# Patient Record
Sex: Female | Born: 1970
Health system: Southern US, Community
[De-identification: ages and names within clinical notes are randomized; demographics above are authoritative.]

## PROBLEM LIST (undated history)

## (undated) DIAGNOSIS — G43909 Migraine, unspecified, not intractable, without status migrainosus: Secondary | ICD-10-CM

## (undated) DIAGNOSIS — J40 Bronchitis, not specified as acute or chronic: Secondary | ICD-10-CM

## (undated) HISTORY — PX: CHOLECYSTECTOMY: SHX55

## (undated) HISTORY — DX: Migraine, unspecified, not intractable, without status migrainosus: G43.909

## (undated) HISTORY — DX: Bronchitis, not specified as acute or chronic: J40

---

## 1999-01-05 HISTORY — PX: ECTOPIC PREGNANCY SURGERY: SHX613

## 2001-03-08 ENCOUNTER — Other Ambulatory Visit: Admission: RE | Admit: 2001-03-08 | Discharge: 2001-03-08 | Payer: Self-pay | Admitting: Obstetrics and Gynecology

## 2001-04-04 ENCOUNTER — Ambulatory Visit (HOSPITAL_COMMUNITY): Admission: RE | Admit: 2001-04-04 | Discharge: 2001-04-04 | Payer: Self-pay | Admitting: Obstetrics & Gynecology

## 2001-04-04 ENCOUNTER — Encounter: Payer: Self-pay | Admitting: Obstetrics & Gynecology

## 2013-04-18 ENCOUNTER — Ambulatory Visit (INDEPENDENT_AMBULATORY_CARE_PROVIDER_SITE_OTHER): Payer: BC Managed Care – PPO | Admitting: Endocrinology

## 2013-04-18 ENCOUNTER — Encounter: Payer: Self-pay | Admitting: Endocrinology

## 2013-04-18 VITALS — BP 122/76 | HR 80 | Temp 97.9°F | Ht 64.5 in | Wt 247.0 lb

## 2013-04-18 DIAGNOSIS — E0590 Thyrotoxicosis, unspecified without thyrotoxic crisis or storm: Secondary | ICD-10-CM

## 2013-04-18 MED ORDER — PROPYLTHIOURACIL 50 MG PO TABS: ORAL_TABLET | ORAL | Status: DC

## 2013-04-18 NOTE — Progress Notes (Signed)
   Subjective:    Patient ID: Traci RoyalsLisa Hammond, female    DOB: 06/06/1970, 43 y.o.   MRN: [id number]  HPI In 2011, pt was rx'ed x a few months for hyperthyroidism, with a daily medication.  It was stopped, due to normalization of her TFT.  In late 2014, she developed slight tremor of the hands, and assoc weight loss.  She was rx'ed tapazole and inderal.  She stopped the tapazole, as she says this was exacerbating her headaches.  She says she would not be able to be isolated from her family for i-131 rx.  No past medical history on file.  No past surgical history on file.  History   Social History  . Marital Status: Single    Spouse Name: N/A    Number of Children: N/A  . Years of Education: N/A   Occupational History  . Not on file.   Social History Main Topics  . Smoking status: Never Smoker   . Smokeless tobacco: Not on file  . Alcohol Use: Yes  . Drug Use: Not on file  . Sexual Activity: Not on file   Other Topics Concern  . Not on file   Social History Narrative  . No narrative on file    No current outpatient prescriptions on file prior to visit.   No current facility-administered medications on file prior to visit.    Not on File  No family history on file. adopted BP 122/76  Pulse 80  Temp(Src) 97.9 F (36.6 C) (Oral)  Ht 5' 4.5" (1.638 m)  Wt 247 lb (112.038 kg)  BMI 41.76 kg/m2  SpO2 97%   Review of Systems She has muscle weakness, fatigue, headache, anxiety, easy bruising, rhinorrhea, and palpitations.  She denies hoarseness, double vision, sob, diarrhea, polyuria, excessive diaphoresis, and numbness.  She has regular menses.      Objective:   Physical Exam VS: see vs page GEN: no distress HEAD: head: no deformity eyes: slight bilateral proptosis external nose and ears are normal mouth: no lesion seen NECK: thyroid is 3 times normal size.  No nodule CHEST WALL: no deformity LUNGS:  Clear to auscultation CV: reg rate and rhythm, no murmur ABD:  abdomen is soft, nontender.  no hepatosplenomegaly.  not distended.  no hernia.   MUSCULOSKELETAL: muscle bulk and strength are grossly normal.  no obvious joint swelling.  gait is normal and steady EXTEMITIES: no deformity.  no ulcer on the feet.  feet are of normal color and temp.  no edema PULSES: dorsalis pedis intact bilat.  no carotid bruit NEURO:  cn 2-12 grossly intact.   readily moves all 4's.  sensation is intact to touch on the feet SKIN:  Normal texture and temperature.  No rash or suspicious lesion is visible.   NODES:  None palpable at the neck PSYCH: alert, well-oriented.  Does not appear anxious nor depressed.   outside test results are reviewed: Free T4=4.41 Thyroid US: mildly heterogeneous    Assessment & Plan:  Grave's dz: persistent. Hyperthyroidism: recurrent off thionamide rx.  we discussed the causes, risks, and treatment options of hyperthyroidism.  She chooses to take PTU, rather than I-131 rx. Tachycardia: well-controlled on inderal.

## 2013-04-18 NOTE — Patient Instructions (Addendum)
i have sent a prescription to your pharmacy, to slow down the thyroid.  if ever you have fever while taking propylthiouracil, stop it and call us, because of the risk of a rare side-effect.  Also, please continue the same propranolol.  Please come back for a follow-up appointment in 2-4 weeks

## 2013-04-19 DIAGNOSIS — E0590 Thyrotoxicosis, unspecified without thyrotoxic crisis or storm: Secondary | ICD-10-CM | POA: Insufficient documentation

## 2013-05-16 ENCOUNTER — Ambulatory Visit: Payer: BC Managed Care – PPO | Admitting: Endocrinology

## 2013-05-30 ENCOUNTER — Ambulatory Visit (INDEPENDENT_AMBULATORY_CARE_PROVIDER_SITE_OTHER): Payer: BC Managed Care – PPO | Admitting: Endocrinology

## 2013-05-30 ENCOUNTER — Encounter: Payer: Self-pay | Admitting: Endocrinology

## 2013-05-30 VITALS — BP 124/84 | HR 95 | Temp 98.3°F | Ht 64.5 in | Wt 250.0 lb

## 2013-05-30 DIAGNOSIS — E0590 Thyrotoxicosis, unspecified without thyrotoxic crisis or storm: Secondary | ICD-10-CM

## 2013-05-30 LAB — T4, FREE: Free T4: 3.33 ng/dL — ABNORMAL HIGH (ref 0.60–1.60)

## 2013-05-30 LAB — TSH: TSH: 0.2 u[IU]/mL — ABNORMAL LOW (ref 0.35–4.50)

## 2013-05-30 MED ORDER — PROPRANOLOL HCL ER 60 MG PO CP24: 60.0000 mg | ORAL_CAPSULE | Freq: Every day | ORAL | Status: DC

## 2013-05-30 NOTE — Progress Notes (Signed)
   Subjective:    Patient ID: Traci Navarro, female    DOB: 1970-02-12, 43 y.o.   MRN: [id number]  HPI Pt returns for f/u of hyperthyroidism: dx'ed 2011; pt was rx'ed x a few months for hyperthyroidism, with a daily medication.  It was stopped, due to normalization of her TFT.  It recurred in late 2014, so she was rx'ed tapazole and inderal.  She did not tolerate tapazole (headache), so she was changed to PTU.  She says she would not be able to be isolated from her family for i-131 rx.  she feels much better in general recently.   No past medical history on file.  No past surgical history on file.  History   Social History  . Marital Status: Single    Spouse Name: N/A    Number of Children: N/A  . Years of Education: N/A   Occupational History  . Not on file.   Social History Main Topics  . Smoking status: Never Smoker   . Smokeless tobacco: Not on file  . Alcohol Use: Yes  . Drug Use: Not on file  . Sexual Activity: Not on file   Other Topics Concern  . Not on file   Social History Narrative  . No narrative on file    Current Outpatient Prescriptions on File Prior to Visit  Medication Sig Dispense Refill  . propylthiouracil (PTU) 50 MG tablet 3 pills, 3 times a day  270 tablet  2   No current facility-administered medications on file prior to visit.    Not on File  No family history on file.  BP 124/84  Pulse 95  Temp(Src) 98.3 F (36.8 C) (Oral)  Ht 5' 4.5" (1.638 m)  Wt 250 lb (113.399 kg)  BMI 42.27 kg/m2  SpO2 97%  Review of Systems Denies fever    Objective:   Physical Exam VITAL SIGNS:  See vs page GENERAL: no distress Skin: not diaphoretic Neuro: no tremor   Lab Results  Component Value Date   TSH 0.20* 05/30/2013      Assessment & Plan:  Hyperthyroidism, not better yet.

## 2013-05-30 NOTE — Patient Instructions (Addendum)
blood tests are being requested for you today.  We'll contact you with results. if ever you have fever while taking propylthiouracil, stop it and call us, because of the risk of a rare side-effect.  Also, please reduce the propranolol to 60 mg daily.  i have sent a prescription to your pharmacy Please come back for a follow-up appointment in 6 weeks.

## 2013-07-11 ENCOUNTER — Encounter: Payer: Self-pay | Admitting: Endocrinology

## 2013-07-11 ENCOUNTER — Ambulatory Visit (INDEPENDENT_AMBULATORY_CARE_PROVIDER_SITE_OTHER): Payer: BC Managed Care – PPO | Admitting: Endocrinology

## 2013-07-11 VITALS — BP 118/76 | HR 71 | Temp 98.4°F | Ht 64.5 in | Wt 256.0 lb

## 2013-07-11 DIAGNOSIS — E0590 Thyrotoxicosis, unspecified without thyrotoxic crisis or storm: Secondary | ICD-10-CM

## 2013-07-11 LAB — T4, FREE: FREE T4: 2 ng/dL — AB (ref 0.60–1.60)

## 2013-07-11 LAB — TSH: TSH: 0.01 u[IU]/mL — ABNORMAL LOW (ref 0.35–4.50)

## 2013-07-11 MED ORDER — METOPROLOL SUCCINATE ER 25 MG PO TB24: 25.0000 mg | ORAL_TABLET | Freq: Every day | ORAL | Status: DC

## 2013-07-11 NOTE — Progress Notes (Signed)
   Subjective:    Patient ID: Traci Navarro, female    DOB: 03/05/1970, 43 y.o.   MRN: [id number]  HPI Pt returns for f/u of hyperthyroidism: dx'ed 2011; pt was rx'ed x a few months for hyperthyroidism, with a daily medication.  It was stopped, due to normalization of her TFT.  It recurred in late 2014, so she was rx'ed tapazole and inderal.  She did not tolerate tapazole (headache), so she was changed to PTU.  She says she would not be able to be isolated from her family for i-131 rx.  she feels much better in general recently.   No past medical history on file.  No past surgical history on file.  History   Social History  . Marital Status: Single    Spouse Name: N/A    Number of Children: N/A  . Years of Education: N/A   Occupational History  . Not on file.   Social History Main Topics  . Smoking status: Never Smoker   . Smokeless tobacco: Not on file  . Alcohol Use: Yes  . Drug Use: Not on file  . Sexual Activity: Not on file   Other Topics Concern  . Not on file   Social History Narrative  . No narrative on file    No current outpatient prescriptions on file prior to visit.   No current facility-administered medications on file prior to visit.    Not on File  No family history on file.  BP 118/76  Pulse 71  Temp(Src) 98.4 F (36.9 C) (Oral)  Ht 5' 4.5" (1.638 m)  Wt 256 lb (116.121 kg)  BMI 43.28 kg/m2  SpO2 99%  Review of Systems Denies fever    Objective:   Physical Exam VITAL SIGNS:  See vs page GENERAL: no distress. NECK: thyroid is twice normal size, diffuse.  No nodule   Lab Results  Component Value Date   TSH 0.01* 07/11/2013      Assessment & Plan:  Hyperthyroidism: only slightly improved.   Patient is advised the following: Patient Instructions  i have sent a prescription to your pharmacy, to change the propranolol to a pill that we can use at a lower amount.   blood tests are being requested for you today.  We'll contact you with  results. Please come back for a follow-up appointment in 2 months.    improved. Please increase medication to 4 pills, 3 times a day

## 2013-07-11 NOTE — Patient Instructions (Signed)
i have sent a prescription to your pharmacy, to change the propranolol to a pill that we can use at a lower amount.   blood tests are being requested for you today.  We'll contact you with results. Please come back for a follow-up appointment in 2 months.

## 2013-07-12 ENCOUNTER — Other Ambulatory Visit: Payer: Self-pay

## 2013-07-12 MED ORDER — PROPYLTHIOURACIL 50 MG PO TABS: ORAL_TABLET | ORAL | Status: DC

## 2013-09-11 ENCOUNTER — Ambulatory Visit: Payer: BC Managed Care – PPO | Admitting: Endocrinology

## 2014-08-05 ENCOUNTER — Other Ambulatory Visit: Payer: Self-pay | Admitting: Endocrinology

## 2014-08-05 NOTE — Telephone Encounter (Signed)
Please refill x 1 Ov is due  

## 2016-04-29 ENCOUNTER — Emergency Department (HOSPITAL_COMMUNITY)
Admission: EM | Admit: 2016-04-29 | Discharge: 2016-04-29 | Disposition: A | Discharge status: Home / Self Care | Payer: No Typology Code available for payment source | Attending: Emergency Medicine | Admitting: Emergency Medicine

## 2016-04-29 ENCOUNTER — Encounter (HOSPITAL_COMMUNITY): Payer: Self-pay

## 2016-04-29 ENCOUNTER — Emergency Department (HOSPITAL_COMMUNITY): Payer: No Typology Code available for payment source

## 2016-04-29 DIAGNOSIS — Y92410 Unspecified street and highway as the place of occurrence of the external cause: Secondary | ICD-10-CM | POA: Insufficient documentation

## 2016-04-29 DIAGNOSIS — S199XXA Unspecified injury of neck, initial encounter: Secondary | ICD-10-CM | POA: Diagnosis present

## 2016-04-29 DIAGNOSIS — M545 Low back pain: Secondary | ICD-10-CM | POA: Insufficient documentation

## 2016-04-29 DIAGNOSIS — Y999 Unspecified external cause status: Secondary | ICD-10-CM | POA: Diagnosis not present

## 2016-04-29 DIAGNOSIS — Y9389 Activity, other specified: Secondary | ICD-10-CM | POA: Diagnosis not present

## 2016-04-29 DIAGNOSIS — Z79899 Other long term (current) drug therapy: Secondary | ICD-10-CM | POA: Diagnosis not present

## 2016-04-29 DIAGNOSIS — M542 Cervicalgia: Secondary | ICD-10-CM | POA: Insufficient documentation

## 2016-04-29 LAB — POC URINE PREG, ED: PREG TEST UR: NEGATIVE

## 2016-04-29 MED ORDER — CYCLOBENZAPRINE HCL 10 MG PO TABS: 10.0000 mg | ORAL_TABLET | Freq: Two times a day (BID) | ORAL | 0 refills | Status: DC | PRN

## 2016-04-29 MED ORDER — IBUPROFEN 800 MG PO TABS
800.0000 mg | ORAL_TABLET | Freq: Once | ORAL | Status: AC
  Administered 2016-04-29: 800 mg via ORAL
  Filled 2016-04-29: qty 1

## 2016-04-29 MED ORDER — IBUPROFEN 600 MG PO TABS: 600.0000 mg | ORAL_TABLET | Freq: Four times a day (QID) | ORAL | 0 refills | Status: AC | PRN

## 2016-04-29 NOTE — ED Notes (Signed)
c-collar applied at triage 
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
## 2018-01-09 DIAGNOSIS — M79601 Pain in right arm: Secondary | ICD-10-CM | POA: Diagnosis not present

## 2018-01-09 DIAGNOSIS — M542 Cervicalgia: Secondary | ICD-10-CM | POA: Diagnosis not present

## 2018-01-09 DIAGNOSIS — R200 Anesthesia of skin: Secondary | ICD-10-CM | POA: Diagnosis not present

## 2018-01-12 DIAGNOSIS — M4802 Spinal stenosis, cervical region: Secondary | ICD-10-CM | POA: Diagnosis not present

## 2018-01-12 DIAGNOSIS — M542 Cervicalgia: Secondary | ICD-10-CM | POA: Diagnosis not present

## 2018-01-12 DIAGNOSIS — M5030 Other cervical disc degeneration, unspecified cervical region: Secondary | ICD-10-CM | POA: Diagnosis not present

## 2018-02-01 DIAGNOSIS — M79601 Pain in right arm: Secondary | ICD-10-CM | POA: Diagnosis not present

## 2018-02-01 DIAGNOSIS — M4802 Spinal stenosis, cervical region: Secondary | ICD-10-CM | POA: Diagnosis not present

## 2018-02-01 DIAGNOSIS — M542 Cervicalgia: Secondary | ICD-10-CM | POA: Diagnosis not present

## 2018-02-09 DIAGNOSIS — M542 Cervicalgia: Secondary | ICD-10-CM | POA: Diagnosis not present

## 2018-02-09 DIAGNOSIS — M25511 Pain in right shoulder: Secondary | ICD-10-CM | POA: Diagnosis not present

## 2018-02-09 DIAGNOSIS — M4802 Spinal stenosis, cervical region: Secondary | ICD-10-CM | POA: Diagnosis not present

## 2018-03-06 DIAGNOSIS — M542 Cervicalgia: Secondary | ICD-10-CM | POA: Diagnosis not present

## 2018-03-06 DIAGNOSIS — M4802 Spinal stenosis, cervical region: Secondary | ICD-10-CM | POA: Diagnosis not present

## 2018-03-14 DIAGNOSIS — M4802 Spinal stenosis, cervical region: Secondary | ICD-10-CM | POA: Diagnosis not present

## 2018-03-14 DIAGNOSIS — M542 Cervicalgia: Secondary | ICD-10-CM | POA: Diagnosis not present

## 2018-03-14 DIAGNOSIS — M79601 Pain in right arm: Secondary | ICD-10-CM | POA: Diagnosis not present

## 2018-04-05 ENCOUNTER — Ambulatory Visit (INDEPENDENT_AMBULATORY_CARE_PROVIDER_SITE_OTHER): Payer: Self-pay

## 2018-04-05 ENCOUNTER — Other Ambulatory Visit: Payer: Self-pay

## 2018-04-05 ENCOUNTER — Ambulatory Visit (INDEPENDENT_AMBULATORY_CARE_PROVIDER_SITE_OTHER): Payer: Self-pay | Admitting: Orthopaedic Surgery

## 2018-04-05 ENCOUNTER — Encounter (INDEPENDENT_AMBULATORY_CARE_PROVIDER_SITE_OTHER): Payer: Self-pay | Admitting: Orthopaedic Surgery

## 2018-04-05 VITALS — Ht 64.0 in | Wt 286.0 lb

## 2018-04-05 DIAGNOSIS — Z6841 Body Mass Index (BMI) 40.0 and over, adult: Secondary | ICD-10-CM | POA: Insufficient documentation

## 2018-04-05 DIAGNOSIS — M25521 Pain in right elbow: Secondary | ICD-10-CM

## 2018-04-05 MED ORDER — METHYLPREDNISOLONE ACETATE 40 MG/ML IJ SUSP
40.0000 mg | INTRAMUSCULAR | Status: AC | PRN
  Administered 2018-04-05: 40 mg

## 2018-04-05 MED ORDER — LIDOCAINE HCL 1 % IJ SOLN
1.0000 mL | INTRAMUSCULAR | Status: AC | PRN
  Administered 2018-04-05: 10:00:00 1 mL

## 2018-04-05 MED ORDER — BUPIVACAINE HCL 0.5 % IJ SOLN
1.0000 mL | INTRAMUSCULAR | Status: AC | PRN
  Administered 2018-04-05: 10:00:00 1 mL
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
## 2018-04-26 ENCOUNTER — Encounter (INDEPENDENT_AMBULATORY_CARE_PROVIDER_SITE_OTHER): Payer: Self-pay | Admitting: Orthopaedic Surgery

## 2018-04-26 ENCOUNTER — Other Ambulatory Visit: Payer: Self-pay

## 2018-04-26 ENCOUNTER — Ambulatory Visit (INDEPENDENT_AMBULATORY_CARE_PROVIDER_SITE_OTHER): Payer: 59 | Admitting: Orthopaedic Surgery

## 2018-04-26 VITALS — Ht 64.0 in | Wt 286.0 lb

## 2018-04-26 DIAGNOSIS — M25521 Pain in right elbow: Secondary | ICD-10-CM | POA: Diagnosis not present

## 2018-04-26 NOTE — Progress Notes (Signed)
   Office Visit Note   Patient: Traci Navarro           Date of Birth: 1970-12-19           MRN: [id number] Visit Date: 04/26/2018              Requested by: Sasser, Clarene Critchley, MD [address] [place], [place] [address] PCP: Estanislado Pandy, MD   Assessment & Plan: Visit Diagnoses:  1. Pain in right elbow     Plan: Minimal relief with cortisone injection over the lateral epicondyles right elbow.  Plan  MRI scan right elbow.  Might also have compression of either radial or median nerve based on some of her symptoms.  Large arms and forearms which make it somewhat difficult to evaluate  Follow-Up Instructions: No follow-ups on file.   Orders:  No orders of the defined types were placed in this encounter.  No orders of the defined types were placed in this encounter.     Procedures: No procedures performed   Clinical Data: No additional findings.   Subjective: Chief Complaint  Patient presents with  . Right Elbow - Follow-up  Patient presents today for follow up on her right elbow. She received a cortisone injection on 04/05/18. She has been doing PT and home exercises. She is having forearm and wrist pain with exercises.  Also has been using Voltaren gel which gives her some relief.  Pain is still localized along the lateral aspect of her right elbow associated with forearm pain and some discomfort referred to the dorsum of her right hand. HPI  Review of Systems   Objective: Vital Signs: Ht 5\' 4"  (1.626 m)   Wt 286 lb (129.7 kg)   BMI 49.09 kg/m   Physical Exam  Ortho Exam awake alert and oriented x3.  Comfortable sitting.  No acute distress.  Full range of motion of right elbow in pronation supination flexion and extension.  Still has some discomfort over the lateral epicondyle.  No skin changes.  Large arms and forearms.  No obvious masses.  Difficult to determine if there is any compression of radial nerve as she does have some discomfort of the dorsum of her hand.  No obvious  carpal tunnel.  No evidence of de Quervain's  Specialty Comments:  No specialty comments available.  Imaging: No results found.   PMFS History: Patient Active Problem List   Diagnosis Date Noted  . Pain in right elbow 04/05/2018  . BMI 45.0-49.9, adult (HCC) 04/05/2018  . Hyperthyroidism 04/19/2013   Past Medical History:  Diagnosis Date  . Bronchitis   . Migraines     History reviewed. No pertinent family history.  Past Surgical History:  Procedure Laterality Date  . CHOLECYSTECTOMY     Social History   Occupational History  . Not on file  Tobacco Use  . Smoking status: Never Smoker  . Smokeless tobacco: Never Used  Substance and Sexual Activity  . Alcohol use: Not Currently  . Drug use: Never  . Sexual activity: Not on file

## 2018-04-26 NOTE — Addendum Note (Signed)
Addended by: Wendi Maya on: 04/26/2018 02:43 PM   Modules accepted: Orders

## 2018-04-28 DIAGNOSIS — M25521 Pain in right elbow: Secondary | ICD-10-CM | POA: Diagnosis not present

## 2018-04-28 DIAGNOSIS — S46211A Strain of muscle, fascia and tendon of other parts of biceps, right arm, initial encounter: Secondary | ICD-10-CM | POA: Diagnosis not present

## 2018-04-28 DIAGNOSIS — M79631 Pain in right forearm: Secondary | ICD-10-CM | POA: Diagnosis not present

## 2018-05-03 ENCOUNTER — Ambulatory Visit (INDEPENDENT_AMBULATORY_CARE_PROVIDER_SITE_OTHER): Payer: 59 | Admitting: Orthopaedic Surgery

## 2018-05-03 ENCOUNTER — Other Ambulatory Visit: Payer: Self-pay

## 2018-05-03 ENCOUNTER — Encounter (INDEPENDENT_AMBULATORY_CARE_PROVIDER_SITE_OTHER): Payer: Self-pay | Admitting: Orthopaedic Surgery

## 2018-05-03 VITALS — Ht 64.0 in | Wt 286.0 lb

## 2018-05-03 DIAGNOSIS — M25521 Pain in right elbow: Secondary | ICD-10-CM

## 2018-05-03 NOTE — Progress Notes (Signed)
   Office Visit Note   Patient: Traci Navarro           Date of Birth: [birth year]           MRN: [id number] Visit Date: 05/03/2018              Requested by: Sasser, Clarene Critchley, MD [address] [place], [place] [address] PCP: Estanislado Pandy, MD   Assessment & Plan: Visit Diagnoses:  1. Pain in right elbow     Plan: MRI scan of right elbow reveals a small partial tear of the insertion of the biceps tendon on the proximal radius.  This seems to fit the area of her pain.  She does not have evidence of tennis elbow by MRI scan and presently is not having any pain laterally.  There was no evidence of ulnar or radial nerve or median nerve entrapment.  Have suggested exercises, Voltaren gel and anti-inflammatory medicines as well as time.  Also discussed limitation of activities and lifting heavy weights.  Hopefully this will resolve without further intervention  Follow-Up Instructions: Return if symptoms worsen or fail to improve.   Orders:  No orders of the defined types were placed in this encounter.  No orders of the defined types were placed in this encounter.     Procedures: No procedures performed   Clinical Data: No additional findings.   Subjective: Chief Complaint  Patient presents with  . Right Elbow - Follow-up  Patient presents today for a one week follow up on her right elbow. She said that she had noticed no change in the last week. She had an MRI on 04/28/18 and is here today to discuss those results. She takes Tylenol as needed for pain.  HPI  Review of Systems   Objective: Vital Signs: Ht 5\' 4"  (1.626 m)   Wt 286 lb (129.7 kg)   BMI 49.09 kg/m   Physical Exam Constitutional:      Appearance: She is well-developed.  Eyes:     Pupils: Pupils are equal, round, and reactive to light.  Pulmonary:     Effort: Pulmonary effort is normal.  Skin:    General: Skin is warm and dry.  Neurological:     Mental Status: She is alert and oriented to person, place, and time.   Psychiatric:        Behavior: Behavior normal.     Ortho Exam large arms.  No localized tenderness at the lateral right elbow.  Full range of motion.  Some discomfort in the area of the biceps tendon distally.  Some pain with forced flexion.  Neurologically intact.  Skin intact.  No evidence of instability Specialty Comments:  No specialty comments available.  Imaging: No results found.   PMFS History: Patient Active Problem List   Diagnosis Date Noted  . Pain in right elbow 04/05/2018  . BMI 45.0-49.9, adult (HCC) 04/05/2018  . Hyperthyroidism 04/19/2013   Past Medical History:  Diagnosis Date  . Bronchitis   . Migraines     History reviewed. No pertinent family history.  Past Surgical History:  Procedure Laterality Date  . CHOLECYSTECTOMY     Social History   Occupational History  . Not on file  Tobacco Use  . Smoking status: Never Smoker  . Smokeless tobacco: Never Used  Substance and Sexual Activity  . Alcohol use: Not Currently  . Drug use: Never  . Sexual activity: Not on file

## 2021-06-21 ENCOUNTER — Encounter (HOSPITAL_COMMUNITY): Payer: Self-pay

## 2021-06-21 ENCOUNTER — Emergency Department (HOSPITAL_COMMUNITY)
Admission: EM | Admit: 2021-06-21 | Discharge: 2021-06-21 | Disposition: A | Discharge status: Home / Self Care | Payer: 59 | Attending: Emergency Medicine | Admitting: Emergency Medicine

## 2021-06-21 ENCOUNTER — Emergency Department (HOSPITAL_COMMUNITY): Payer: 59

## 2021-06-21 ENCOUNTER — Other Ambulatory Visit: Payer: Self-pay

## 2021-06-21 DIAGNOSIS — R42 Dizziness and giddiness: Secondary | ICD-10-CM | POA: Insufficient documentation

## 2021-06-21 DIAGNOSIS — D72829 Elevated white blood cell count, unspecified: Secondary | ICD-10-CM | POA: Insufficient documentation

## 2021-06-21 DIAGNOSIS — R112 Nausea with vomiting, unspecified: Secondary | ICD-10-CM | POA: Diagnosis not present

## 2021-06-21 LAB — CBC WITH DIFFERENTIAL/PLATELET
Abs Immature Granulocytes: 0.04 10*3/uL (ref 0.00–0.07)
Basophils Absolute: 0 10*3/uL (ref 0.0–0.1)
Basophils Relative: 0 %
Eosinophils Absolute: 0 10*3/uL (ref 0.0–0.5)
Eosinophils Relative: 0 %
HCT: 41.1 % (ref 36.0–46.0)
Hemoglobin: 13.3 g/dL (ref 12.0–15.0)
Immature Granulocytes: 0 %
Lymphocytes Relative: 7 %
Lymphs Abs: 0.9 10*3/uL (ref 0.7–4.0)
MCH: 30.1 pg (ref 26.0–34.0)
MCHC: 32.4 g/dL (ref 30.0–36.0)
MCV: 93 fL (ref 80.0–100.0)
Monocytes Absolute: 0.5 10*3/uL (ref 0.1–1.0)
Monocytes Relative: 4 %
Neutro Abs: 10.8 10*3/uL — ABNORMAL HIGH (ref 1.7–7.7)
Neutrophils Relative %: 89 %
Platelets: 343 10*3/uL (ref 150–400)
RBC: 4.42 MIL/uL (ref 3.87–5.11)
RDW: 13.7 % (ref 11.5–15.5)
WBC: 12.2 10*3/uL — ABNORMAL HIGH (ref 4.0–10.5)
nRBC: 0 % (ref 0.0–0.2)

## 2021-06-21 LAB — TSH: TSH: 1.506 u[IU]/mL (ref 0.350–4.500)

## 2021-06-21 LAB — COMPREHENSIVE METABOLIC PANEL
ALT: 22 U/L (ref 0–44)
AST: 15 U/L (ref 15–41)
Albumin: 4 g/dL (ref 3.5–5.0)
Alkaline Phosphatase: 56 U/L (ref 38–126)
Anion gap: 7 (ref 5–15)
BUN: 17 mg/dL (ref 6–20)
CO2: 26 mmol/L (ref 22–32)
Calcium: 8.8 mg/dL — ABNORMAL LOW (ref 8.9–10.3)
Chloride: 106 mmol/L (ref 98–111)
Creatinine, Ser: 0.77 mg/dL (ref 0.44–1.00)
GFR, Estimated: >60 mL/min (ref >60)
Glucose, Bld: 115 mg/dL — ABNORMAL HIGH (ref 70–99)
Potassium: 4.2 mmol/L (ref 3.5–5.1)
Sodium: 139 mmol/L (ref 135–145)
Total Bilirubin: 0.7 mg/dL (ref 0.3–1.2)
Total Protein: 7.5 g/dL (ref 6.5–8.1)

## 2021-06-21 MED ORDER — ONDANSETRON 4 MG PO TBDP: 4.0000 mg | ORAL_TABLET | Freq: Three times a day (TID) | ORAL | 0 refills | Status: AC | PRN

## 2021-06-21 MED ORDER — ONDANSETRON 4 MG PO TBDP
4.0000 mg | ORAL_TABLET | Freq: Once | ORAL | Status: AC | PRN
  Administered 2021-06-21: 4 mg via ORAL
  Filled 2021-06-21: qty 1

## 2021-06-21 MED ORDER — MECLIZINE HCL 12.5 MG PO TABS
25.0000 mg | ORAL_TABLET | Freq: Once | ORAL | Status: AC
  Administered 2021-06-21: 25 mg via ORAL
  Filled 2021-06-21: qty 2

## 2021-06-21 MED ORDER — SODIUM CHLORIDE 0.9 % IV SOLN: INTRAVENOUS | Status: DC

## 2021-06-21 MED ORDER — SODIUM CHLORIDE 0.9 % IV BOLUS
1000.0000 mL | Freq: Once | INTRAVENOUS | Status: AC
  Administered 2021-06-21: 1000 mL via INTRAVENOUS

## 2021-06-21 MED ORDER — ONDANSETRON HCL 4 MG/2ML IJ SOLN
4.0000 mg | Freq: Once | INTRAMUSCULAR | Status: AC
Start: 2021-06-21 — End: 2021-06-21
  Administered 2021-06-21: 4 mg via INTRAVENOUS
  Filled 2021-06-21: qty 2

## 2021-06-21 MED ORDER — MECLIZINE HCL 25 MG PO TABS: 25.0000 mg | ORAL_TABLET | Freq: Three times a day (TID) | ORAL | 0 refills | Status: AC | PRN

## 2021-06-21 NOTE — ED Notes (Signed)
Pt states zofran helped some but nausea worse with movement

## 2021-06-21 NOTE — ED Provider Notes (Signed)
[facility] EMERGENCY DEPARTMENT Provider Note   CSN: [id number] Arrival date & time: 06/21/21  1300     History  Chief Complaint  Patient presents with   Dizziness    Traci Navarro is a 51 y.o. female.  Patient had about noon time yesterday started to get dizziness and room spinning particularly with movement of her head if she laid perfectly still and lay down she felt better.  Associated with some nausea and vomiting.  No real other illness.  Other than that she has been tired with it.  Never anything like this before.  No speech problems no visual problems no upper extremity lower extremity weakness no numbness.  Past medical history sniffing for bronchitis and migraines no real headache with this.  And patient is had a cholecystectomy.  Patient non-smoker.  No ear ringing no ear pain currently.       Home Medications Prior to Admission medications   Medication Sig Start Date End Date Taking? Authorizing Provider  ibuprofen (ADVIL,MOTRIN) 600 MG tablet Take 1 tablet (600 mg total) by mouth every 6 (six) hours as needed. 04/29/16  Yes Fayrene Helper, PA-C  meclizine (ANTIVERT) 25 MG tablet Take 1 tablet (25 mg total) by mouth 3 (three) times daily as needed for dizziness. 06/21/21  Yes Vanetta Mulders, MD  naproxen sodium (ALEVE) 220 MG tablet Take 220 mg by mouth See admin instructions. 2 tablets every morning and 1 tablet every evening as needed for pain   Yes [provider]  ondansetron (ZOFRAN-ODT) 4 MG disintegrating tablet Take 1 tablet (4 mg total) by mouth every 8 (eight) hours as needed for nausea or vomiting. 06/21/21  Yes Vanetta Mulders, MD  rizatriptan (MAXALT) 10 MG tablet Take 10 mg by mouth as needed for migraine. Take 1 tablet 1 to 2 times daily as needed 04/02/21  Yes [provider]  topiramate (TOPAMAX) 50 MG tablet Take 50 mg by mouth at bedtime. 06/04/21  Yes [provider]  propylthiouracil (PTU) 50 MG tablet TAKE FOUR TABLETS BY MOUTH  THREE TIMES DAILY Patient not taking: Reported on 06/21/2021 08/05/14   Romero Belling, MD      Allergies    Patient has no known allergies.    Review of Systems   Review of Systems  Constitutional:  Negative for chills and fever.  HENT:  Negative for ear pain and sore throat.   Eyes:  Negative for pain and visual disturbance.  Respiratory:  Negative for cough and shortness of breath.   Cardiovascular:  Negative for chest pain and palpitations.  Gastrointestinal:  Positive for nausea and vomiting. Negative for abdominal pain.  Genitourinary:  Negative for dysuria and hematuria.  Musculoskeletal:  Negative for arthralgias and back pain.  Skin:  Negative for color change and rash.  Neurological:  Positive for dizziness. Negative for seizures, syncope, facial asymmetry, speech difficulty, weakness, numbness and headaches.  All other systems reviewed and are negative.   Physical Exam Updated Vital Signs BP 103/63   Pulse (!) 49   Temp 97.8 F (36.6 C) (Oral)   Resp 13   Ht 1.638 m (5' 4.5")   Wt (!) 137 kg   LMP 06/09/2021   SpO2 93%   BMI 51.04 kg/m  Physical Exam Vitals and nursing note reviewed.  Constitutional:      General: She is not in acute distress.    Appearance: Normal appearance. She is well-developed.  HENT:     Head: Normocephalic and atraumatic.  Right Ear: Tympanic membrane normal.     Left Ear: Tympanic membrane normal.     Mouth/Throat:     Mouth: Mucous membranes are moist.  Eyes:     Extraocular Movements: Extraocular movements intact.     Conjunctiva/sclera: Conjunctivae normal.     Pupils: Pupils are equal, round, and reactive to light.     Comments: No nystagmus  Cardiovascular:     Rate and Rhythm: Normal rate and regular rhythm.     Heart sounds: No murmur heard. Pulmonary:     Effort: Pulmonary effort is normal. No respiratory distress.     Breath sounds: Normal breath sounds.  Abdominal:     Palpations: Abdomen is soft.     Tenderness:  There is no abdominal tenderness.  Musculoskeletal:        General: No swelling.     Cervical back: Normal range of motion and neck supple.  Skin:    General: Skin is warm and dry.     Capillary Refill: Capillary refill takes less than 2 seconds.  Neurological:     General: No focal deficit present.     Mental Status: She is alert and oriented to person, place, and time.     Cranial Nerves: No cranial nerve deficit.     Sensory: No sensory deficit.     Motor: No weakness.     Comments: Movement of head left to right brings on the room spinning.  Psychiatric:        Mood and Affect: Mood normal.     ED Results / Procedures / Treatments   Labs (all labs ordered are listed, but only abnormal results are displayed) Labs Reviewed  COMPREHENSIVE METABOLIC PANEL - Abnormal; Notable for the following components:      Result Value   Glucose, Bld 115 (*)    Calcium 8.8 (*)    All other components within normal limits  CBC WITH DIFFERENTIAL/PLATELET - Abnormal; Notable for the following components:   WBC 12.2 (*)    Neutro Abs 10.8 (*)    All other components within normal limits  TSH    EKG EKG Interpretation  Date/Time:  Sunday June 21 2021 16:43:28 EDT Ventricular Rate:  62 PR Interval:  72 QRS Duration: 102 QT Interval:  423 QTC Calculation: 430 R Axis:   22 Text Interpretation: Sinus or ectopic atrial rhythm Short PR interval Low voltage, precordial leads No previous ECGs available Confirmed by Vanetta Mulders [phone]) on 06/21/2021 4:51:00 PM  Radiology CT Head Wo Contrast  Result Date: 06/21/2021 CLINICAL DATA:  Dizziness EXAM: CT HEAD WITHOUT CONTRAST TECHNIQUE: Contiguous axial images were obtained from the base of the skull through the vertex without intravenous contrast. RADIATION DOSE REDUCTION: This exam was performed according to the departmental dose-optimization program which includes automated exposure control, adjustment of the mA and/or kV according to patient  size and/or use of iterative reconstruction technique. COMPARISON:  None Available. FINDINGS: Brain: No acute intracranial hemorrhage, mass effect, or herniation. No extra-axial fluid collections. No evidence of acute territorial infarct. No hydrocephalus. Vascular: No hyperdense vessel or unexpected calcification. Skull: Normal. Negative for fracture or focal lesion. Sinuses/Orbits: No acute finding. Other: None. IMPRESSION: No acute intracranial process identified. Electronically Signed   By: Jannifer Hick M.D.   On: 06/21/2021 17:39    Procedures Procedures    Medications Ordered in ED Medications  0.9 %  sodium chloride infusion (has no administration in time range)  ondansetron (ZOFRAN-ODT) disintegrating tablet 4 mg (4  mg Oral Given 06/21/21 1337)  sodium chloride 0.9 % bolus 1,000 mL (0 mLs Intravenous Stopped 06/21/21 1831)  ondansetron (ZOFRAN) injection 4 mg (4 mg Intravenous Given 06/21/21 1647)  meclizine (ANTIVERT) tablet 25 mg (25 mg Oral Given 06/21/21 1648)    ED Course/ Medical Decision Making/ A&P                           Medical Decision Making Amount and/or Complexity of Data Reviewed Labs: ordered. Radiology: ordered.  Risk Prescription drug management.   Symptoms seem to be consistent with positional vertigo.  Head CT negative.  Complete metabolic panel without any acute abnormalities other than calcium down a little bit at 8.8.  Renal function is normal.  CBC with mild leukocytosis at 12.2 no fevers here.  Hemoglobin 13.3 patient has a history of thyroid disease or did so TSH was checked and it is normal at 1.5.  Patient feels much better after some IV fluids after the Antivert and Zofran.  Patient stable enough for discharge home with Antivert and Zofran follow-up with neurology and follow-up with her primary care doctor.  Returning for any new or worse symptoms.  Final Clinical Impression(s) / ED Diagnoses Final diagnoses:  Vertigo    Rx / DC Orders ED  Discharge Orders          Ordered    meclizine (ANTIVERT) 25 MG tablet  3 times daily PRN        06/21/21 1917    ondansetron (ZOFRAN-ODT) 4 MG disintegrating tablet  Every 8 hours PRN        06/21/21 1917              Vanetta Mulders, MD 06/21/21 1918

## 2021-06-21 NOTE — Discharge Instructions (Addendum)
Take the Zofran as needed for nausea.  Take the Antivert for the dizziness.  Make an appointment to follow-up with your primary care doctor this week if possible.  Make an appointment to follow-up with neurology in case symptoms persist.  Return for any new or worse symptoms or anything that could be strokelike like visual loss numbness weakness speech problems.  There is a 24-hour pharmacy down in [place], actually [facility] and a [facility] right across the street from each other at [address] the corner of [address], [facility] phone number [phone].  Rest at home is much as possible minimize your movements.  Definitely take the Antivert to help with the dizziness.

## 2021-06-21 NOTE — ED Triage Notes (Addendum)
Pt presents with gradual onset of dizziness that started yesterday afternoon. Pt states symptoms are worsened by sudden positional changes or head movements and palliated by lying down with eyes closed. Pt has associated nausea. No focal neuro deficits noted in triage.

## 2023-11-19 IMAGING — CT CT HEAD W/O CM
4 series · 17 of 47 positions shown, 19 images · non-contrast
Comparison: None Available.

CLINICAL DATA: Dizziness



[Series 2: head w o · axial · 0.44mm/px · z∈[-19,+96]mm · 7 of 31 slices shown, 9 images]
[im 4/31  brain]
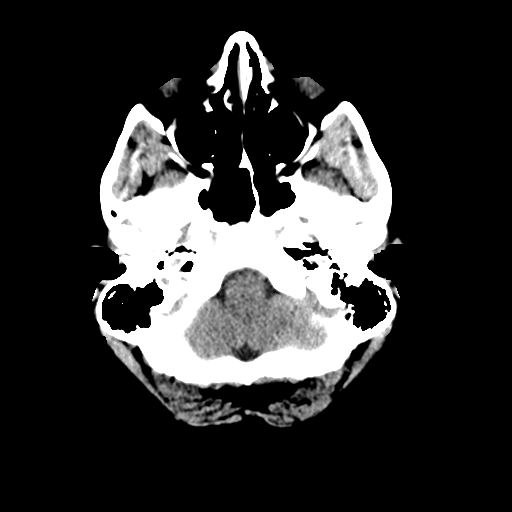
[im 4/31  bone]
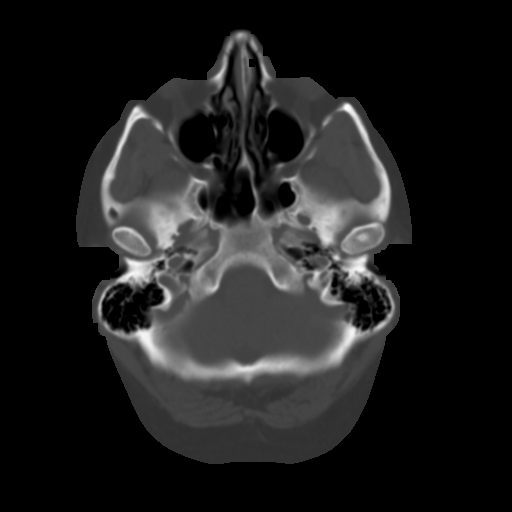
[im 8/31  brain]
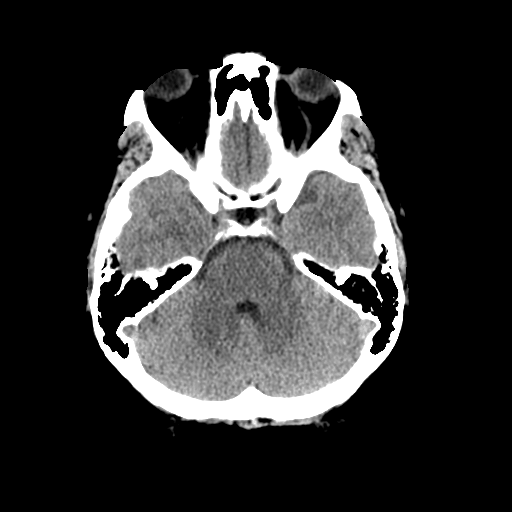
[im 12/31  brain]
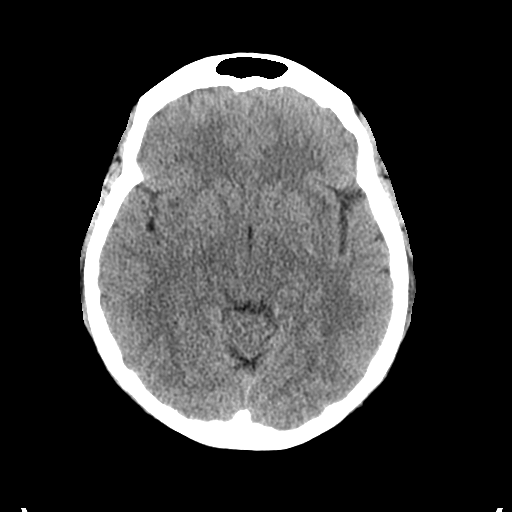
[im 16/31  brain]
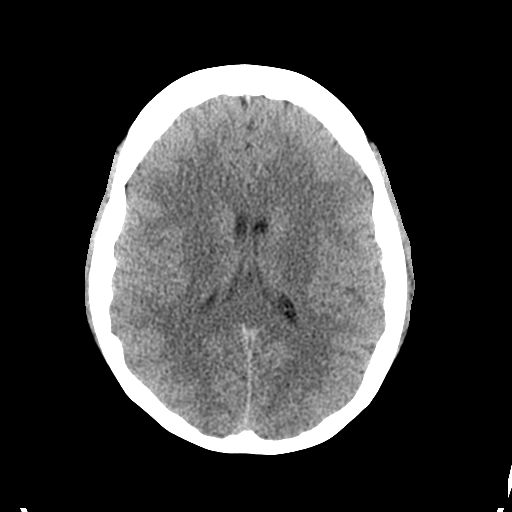
[im 19/31  brain]
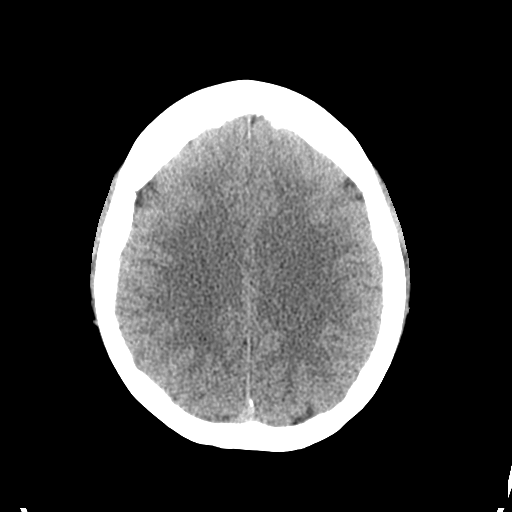
[im 19/31  bone]
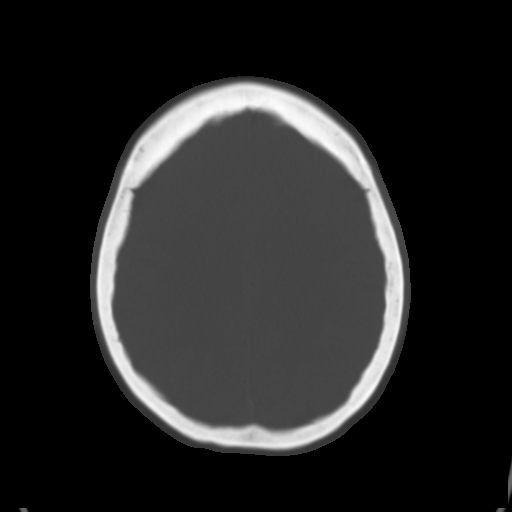
[im 23/31  brain]
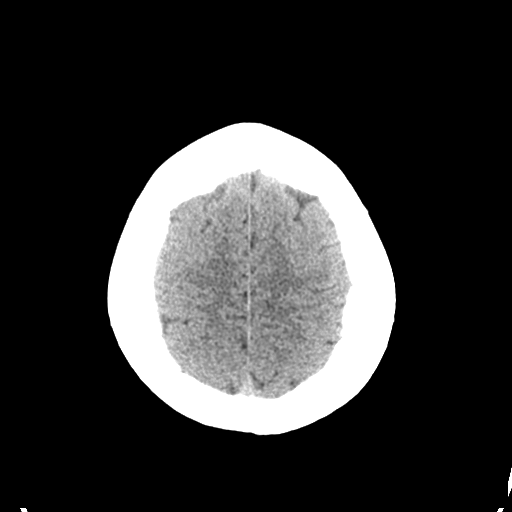
[im 27/31  brain]
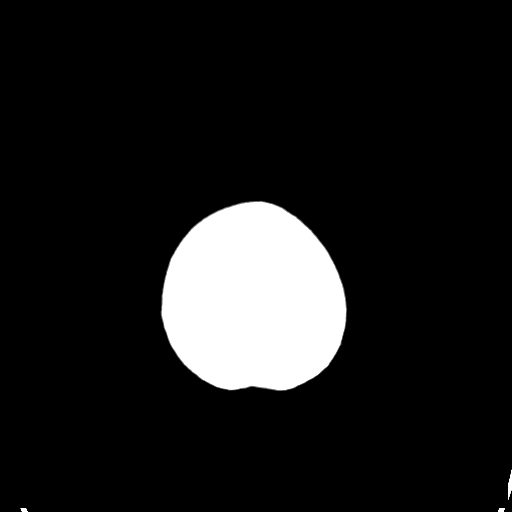

[Series 3: head bone · axial · 0.44mm/px · z∈[-20,+34]mm · 4 of 77 slices shown]
[im 8/77  bone]
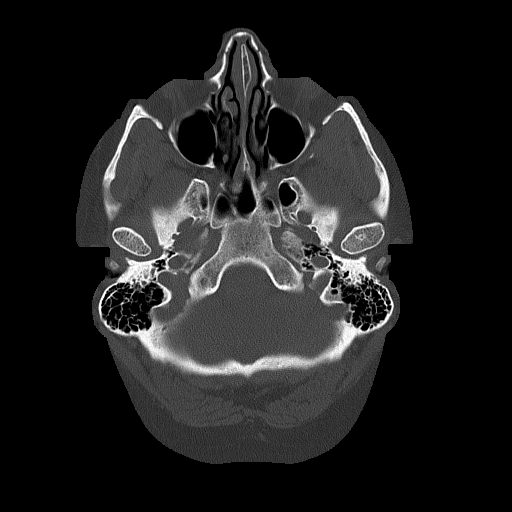
[im 16/77  bone]
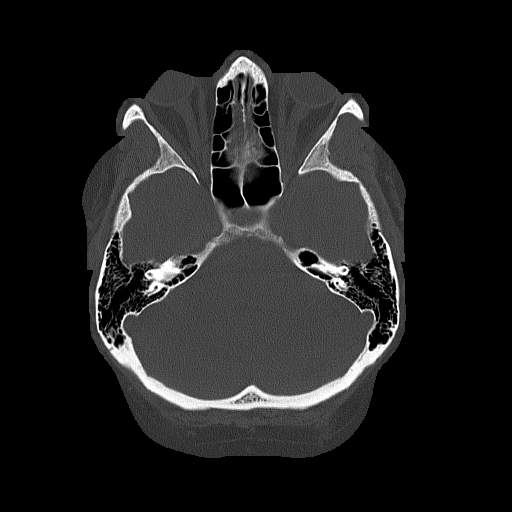
[im 23/77  bone]
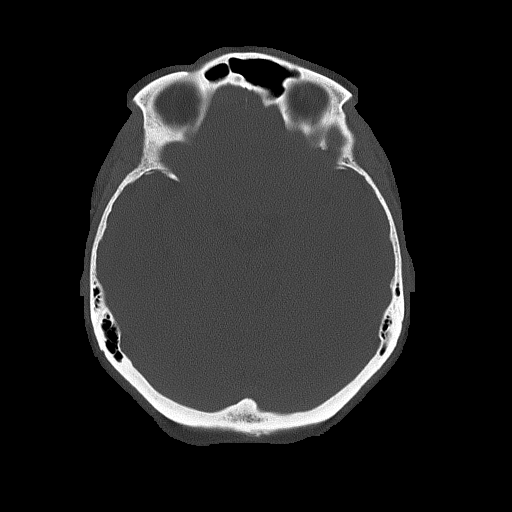
[im 35/77  bone]
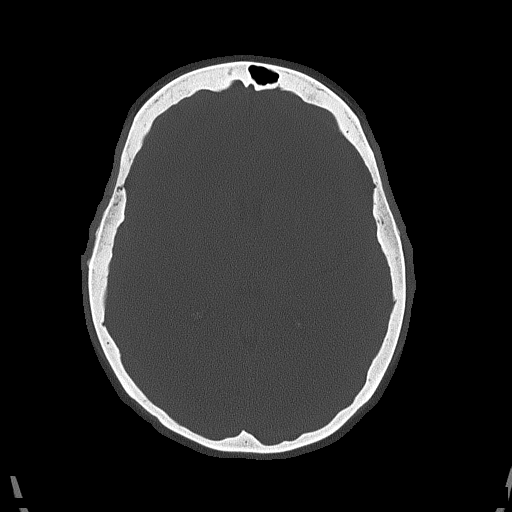

[Series 4: coronal soft · coronal · 0.33mm/px · 3 of 70 slices shown]
[im 24/70  brain]
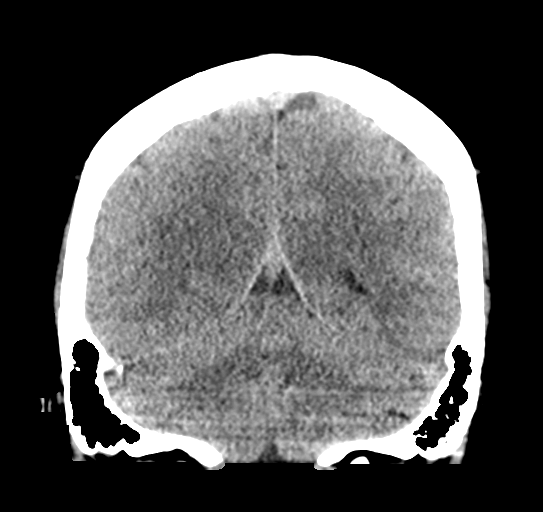
[im 31/70  brain]
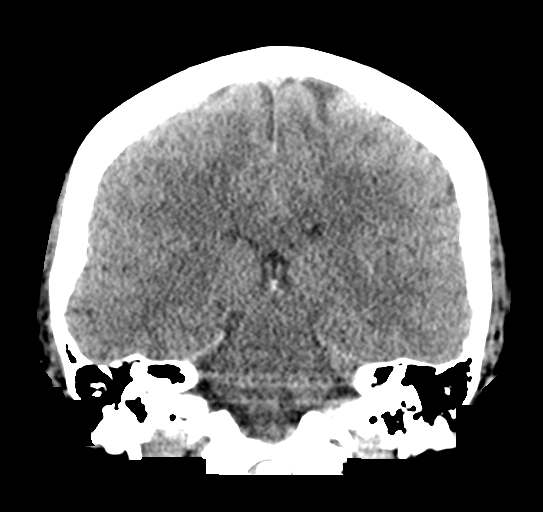
[im 39/70  brain]
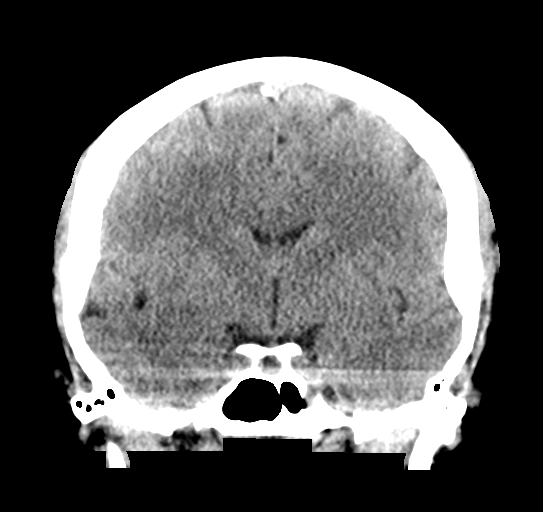

[Series 5: sagittal soft · sagittal · 0.33mm/px · 3 of 60 slices shown]
[im 20/60  brain]
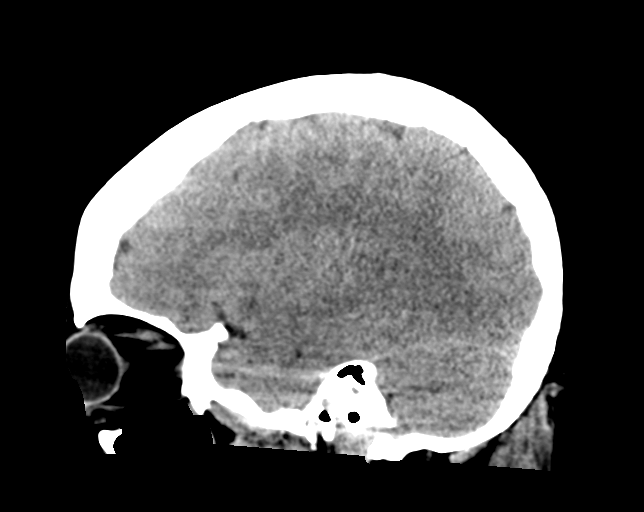
[im 30/60  brain]
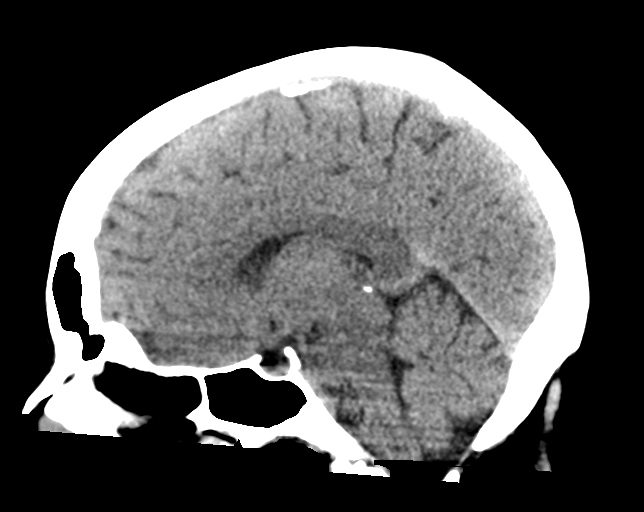
[im 40/60  brain]
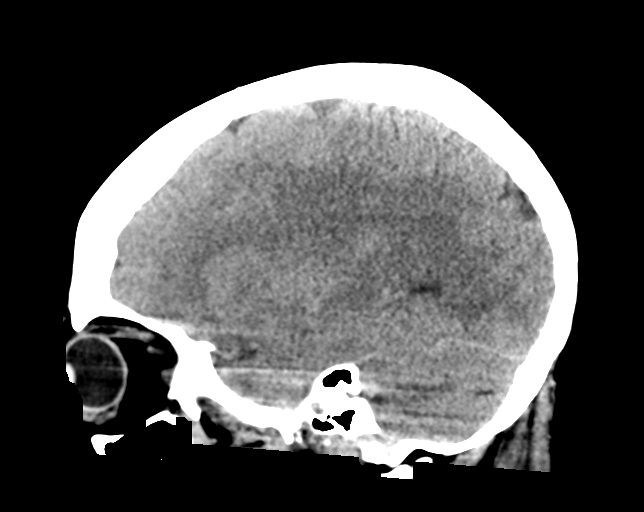

[17 of 47 positions shown; findings below may reference images not displayed]

FINDINGS: Brain: No acute intracranial hemorrhage, mass effect, or herniation.
No extra-axial fluid collections. No evidence of acute territorial
infarct. No hydrocephalus.

Vascular: No hyperdense vessel or unexpected calcification.

Skull: Normal. Negative for fracture or focal lesion.

Sinuses/Orbits: No acute finding.

Other: None.
IMPRESSION: No acute intracranial process identified.

## 2024-02-07 ENCOUNTER — Encounter: Admitting: Obstetrics & Gynecology

## 2024-02-16 ENCOUNTER — Encounter: Admitting: Obstetrics & Gynecology
